# Patient Record
Sex: Female | Born: 1980 | Hispanic: Yes | Marital: Married | Smoking: Never smoker
Health system: Southern US, Community
[De-identification: ages and names within clinical notes are randomized; demographics above are authoritative.]

## PROBLEM LIST (undated history)

## (undated) DIAGNOSIS — M549 Dorsalgia, unspecified: Secondary | ICD-10-CM

## (undated) DIAGNOSIS — K219 Gastro-esophageal reflux disease without esophagitis: Secondary | ICD-10-CM

---

## 2016-01-31 ENCOUNTER — Encounter (HOSPITAL_COMMUNITY): Payer: Self-pay | Admitting: *Deleted

## 2016-01-31 ENCOUNTER — Emergency Department (HOSPITAL_COMMUNITY)
Admission: EM | Admit: 2016-01-31 | Discharge: 2016-01-31 | Disposition: A | Discharge status: Home / Self Care | Payer: No Typology Code available for payment source | Attending: Emergency Medicine | Admitting: Emergency Medicine

## 2016-01-31 ENCOUNTER — Emergency Department (HOSPITAL_COMMUNITY): Payer: No Typology Code available for payment source

## 2016-01-31 DIAGNOSIS — S8012XA Contusion of left lower leg, initial encounter: Secondary | ICD-10-CM | POA: Diagnosis not present

## 2016-01-31 DIAGNOSIS — S3992XA Unspecified injury of lower back, initial encounter: Secondary | ICD-10-CM | POA: Insufficient documentation

## 2016-01-31 DIAGNOSIS — Y998 Other external cause status: Secondary | ICD-10-CM | POA: Insufficient documentation

## 2016-01-31 DIAGNOSIS — Z3202 Encounter for pregnancy test, result negative: Secondary | ICD-10-CM | POA: Diagnosis not present

## 2016-01-31 DIAGNOSIS — Y92410 Unspecified street and highway as the place of occurrence of the external cause: Secondary | ICD-10-CM | POA: Insufficient documentation

## 2016-01-31 DIAGNOSIS — M25552 Pain in left hip: Secondary | ICD-10-CM

## 2016-01-31 DIAGNOSIS — S46911A Strain of unspecified muscle, fascia and tendon at shoulder and upper arm level, right arm, initial encounter: Secondary | ICD-10-CM | POA: Insufficient documentation

## 2016-01-31 DIAGNOSIS — S46811A Strain of other muscles, fascia and tendons at shoulder and upper arm level, right arm, initial encounter: Secondary | ICD-10-CM

## 2016-01-31 DIAGNOSIS — Y9389 Activity, other specified: Secondary | ICD-10-CM | POA: Insufficient documentation

## 2016-01-31 DIAGNOSIS — S8002XA Contusion of left knee, initial encounter: Secondary | ICD-10-CM | POA: Insufficient documentation

## 2016-01-31 DIAGNOSIS — Z8719 Personal history of other diseases of the digestive system: Secondary | ICD-10-CM | POA: Diagnosis not present

## 2016-01-31 DIAGNOSIS — S46812A Strain of other muscles, fascia and tendons at shoulder and upper arm level, left arm, initial encounter: Secondary | ICD-10-CM

## 2016-01-31 DIAGNOSIS — S4991XA Unspecified injury of right shoulder and upper arm, initial encounter: Secondary | ICD-10-CM | POA: Diagnosis present

## 2016-01-31 DIAGNOSIS — S79912A Unspecified injury of left hip, initial encounter: Secondary | ICD-10-CM | POA: Insufficient documentation

## 2016-01-31 DIAGNOSIS — S46912A Strain of unspecified muscle, fascia and tendon at shoulder and upper arm level, left arm, initial encounter: Secondary | ICD-10-CM | POA: Insufficient documentation

## 2016-01-31 HISTORY — DX: Gastro-esophageal reflux disease without esophagitis: K21.9

## 2016-01-31 HISTORY — DX: Dorsalgia, unspecified: M54.9

## 2016-01-31 LAB — I-STAT BETA HCG BLOOD, ED (MC, WL, AP ONLY): I-stat hCG, quantitative: <5 m[IU]/mL (ref <5)

## 2016-01-31 MED ORDER — METHOCARBAMOL 500 MG PO TABS: 500.0000 mg | ORAL_TABLET | Freq: Two times a day (BID) | ORAL | Status: AC

## 2016-01-31 MED ORDER — KETOROLAC TROMETHAMINE 60 MG/2ML IM SOLN
60.0000 mg | Freq: Once | INTRAMUSCULAR | Status: AC
  Administered 2016-01-31: 60 mg via INTRAMUSCULAR
  Filled 2016-01-31: qty 2

## 2016-01-31 MED ORDER — IBUPROFEN 800 MG PO TABS: 800.0000 mg | ORAL_TABLET | Freq: Three times a day (TID) | ORAL | Status: AC

## 2016-01-31 MED ORDER — METHOCARBAMOL 500 MG PO TABS
1000.0000 mg | ORAL_TABLET | Freq: Once | ORAL | Status: AC
  Administered 2016-01-31: 1000 mg via ORAL
  Filled 2016-01-31: qty 2

## 2016-01-31 NOTE — ED Notes (Signed)
Pt ambulated independently in the hall.  

## 2016-01-31 NOTE — ED Notes (Signed)
All interventions performed by Michael Peters performed under direct supervision of Aileena Iglesia RN. 
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
## 2016-01-31 NOTE — ED Notes (Signed)
Pt had all belongings at d/c.  
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
## 2016-01-31 NOTE — ED Notes (Signed)
Pt wants to wait on PO medications due to not wanting to sit up.
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
